# Patient Record
Sex: Female | Born: 1990 | Race: White | Hispanic: No | Marital: Single | Smoking: Never smoker
Health system: Southern US, Community
[De-identification: ages and names within clinical notes are randomized; demographics above are authoritative.]

## PROBLEM LIST (undated history)

## (undated) DIAGNOSIS — D352 Benign neoplasm of pituitary gland: Secondary | ICD-10-CM

## (undated) DIAGNOSIS — K760 Fatty (change of) liver, not elsewhere classified: Secondary | ICD-10-CM

## (undated) HISTORY — PX: NO PAST SURGERIES: SHX2092

## (undated) HISTORY — DX: Benign neoplasm of pituitary gland: D35.2

## (undated) HISTORY — DX: Fatty (change of) liver, not elsewhere classified: K76.0

---

## 2015-01-19 ENCOUNTER — Other Ambulatory Visit (HOSPITAL_COMMUNITY): Payer: Self-pay | Admitting: General Practice

## 2015-01-19 ENCOUNTER — Ambulatory Visit (HOSPITAL_COMMUNITY)
Admission: RE | Admit: 2015-01-19 | Discharge: 2015-01-19 | Disposition: A | Discharge status: Home / Self Care | Payer: Self-pay | Source: Ambulatory Visit | Attending: General Practice | Admitting: General Practice

## 2015-01-19 DIAGNOSIS — A150 Tuberculosis of lung: Secondary | ICD-10-CM

## 2016-05-19 DIAGNOSIS — Z0000 Encounter for general adult medical examination without abnormal findings: Secondary | ICD-10-CM | POA: Diagnosis not present

## 2016-05-21 DIAGNOSIS — Z0000 Encounter for general adult medical examination without abnormal findings: Secondary | ICD-10-CM | POA: Diagnosis not present

## 2016-05-21 DIAGNOSIS — K760 Fatty (change of) liver, not elsewhere classified: Secondary | ICD-10-CM | POA: Diagnosis not present

## 2016-05-21 DIAGNOSIS — R740 Nonspecific elevation of levels of transaminase and lactic acid dehydrogenase [LDH]: Secondary | ICD-10-CM | POA: Diagnosis not present

## 2016-05-21 DIAGNOSIS — Z6834 Body mass index (BMI) 34.0-34.9, adult: Secondary | ICD-10-CM | POA: Diagnosis not present

## 2016-05-21 DIAGNOSIS — N926 Irregular menstruation, unspecified: Secondary | ICD-10-CM | POA: Diagnosis not present

## 2017-11-30 ENCOUNTER — Telehealth: Payer: No Typology Code available for payment source | Admitting: Family

## 2017-11-30 ENCOUNTER — Ambulatory Visit: Payer: Self-pay | Admitting: Physician Assistant

## 2017-11-30 DIAGNOSIS — H109 Unspecified conjunctivitis: Secondary | ICD-10-CM | POA: Diagnosis not present

## 2017-11-30 MED ORDER — POLYMYXIN B-TRIMETHOPRIM 10000-0.1 UNIT/ML-% OP SOLN: 2.0000 [drp] | Freq: Four times a day (QID) | OPHTHALMIC | 0 refills | Status: DC

## 2017-11-30 MED FILL — POLYMYXIN B/TMP EYE DROPS: 10000-0.1 | 5 days supply | Qty: 10 | Fill #0

## 2017-11-30 NOTE — Progress Notes (Signed)
Thank you for the details you included in the comment boxes. Those details are very helpful in determining the best course of treatment for you and help [place] to provide the best care.  We are sorry that you are not feeling well.  Here is how we plan to help!  Based on what you have shared with me it looks like you have conjunctivitis.  Conjunctivitis is a common inflammatory or infectious condition of the eye that is often referred to as "pink eye".  In most cases it is contagious (viral or bacterial). However, not all conjunctivitis requires antibiotics (ex. Allergic).  We have made appropriate suggestions for you based upon your presentation.  I have prescribed Polytrim Ophthalmic drops, (BOTH EYES), 2 drops 4 times a day times 5 days  Pink eye can be highly contagious.  It is typically spread through direct contact with secretions, or contaminated objects or surfaces that one may have touched.  Strict handwashing is suggested with soap and water is urged.  If not available, use alcohol based had sanitizer.  Avoid unnecessary touching of the eye.  If you wear contact lenses, you will need to refrain from wearing them until you see no white discharge from the eye for at least 24 hours after being on medication.  You should see symptom improvement in 1-2 days after starting the medication regimen.  Call us if symptoms are not improved in 1-2 days.  Home Care:  Wash your hands often!  Do not wear your contacts until you complete your treatment plan.  Avoid sharing towels, bed linen, personal items with a person who has pink eye.  See attention for anyone in your home with similar symptoms.  Get Help Right Away If:  Your symptoms do not improve.  You develop blurred or loss of vision.  Your symptoms worsen (increased discharge, pain or redness)  Your e-visit answers were reviewed by a board certified advanced clinical practitioner to complete your personal care plan.  Depending on the  condition, your plan could have included both over the counter or prescription medications.  If there is a problem please reply  once you have received a response from your provider.  Your safety is important to [place].  If you have drug allergies check your prescription carefully.    You can use MyChart to ask questions about today's visit, request a non-urgent call back, or ask for a work or school excuse for 24 hours related to this e-Visit. If it has been greater than 24 hours you will need to follow up with your provider, or enter a new e-Visit to address those concerns.   You will get an e-mail in the next two days asking about your experience.  I hope that your e-visit has been valuable and will speed your recovery. Thank you for using e-visits.

## 2017-12-03 ENCOUNTER — Ambulatory Visit (INDEPENDENT_AMBULATORY_CARE_PROVIDER_SITE_OTHER): Payer: No Typology Code available for payment source | Admitting: Family Medicine

## 2017-12-03 ENCOUNTER — Encounter: Payer: Self-pay | Admitting: Family Medicine

## 2017-12-03 VITALS — BP 108/78 | HR 93 | Temp 99.0°F | Ht 64.0 in | Wt 212.1 lb

## 2017-12-03 DIAGNOSIS — Z23 Encounter for immunization: Secondary | ICD-10-CM

## 2017-12-03 DIAGNOSIS — Z114 Encounter for screening for human immunodeficiency virus [HIV]: Secondary | ICD-10-CM | POA: Diagnosis not present

## 2017-12-03 DIAGNOSIS — D352 Benign neoplasm of pituitary gland: Secondary | ICD-10-CM | POA: Diagnosis not present

## 2017-12-03 DIAGNOSIS — Z0000 Encounter for general adult medical examination without abnormal findings: Secondary | ICD-10-CM

## 2017-12-03 DIAGNOSIS — N912 Amenorrhea, unspecified: Secondary | ICD-10-CM | POA: Diagnosis not present

## 2017-12-03 NOTE — Patient Instructions (Signed)
Call Center for [place] at [facility] at [phone] for an appointment.  They are located at [address], [place], [place], [address] (right across the hall from our office).  Keep up the good work.  Stop the eye drops for now.  Artificial tears like Refresh and Systane may be used for comfort. OK to get generic version. Generally people use them every 2-4 hours, but you can use them as much as you want because there is no medication in it.  Let us know if you need anything.

## 2017-12-03 NOTE — Progress Notes (Signed)
Chief Complaint  Patient presents with  . Establish Care     Well Woman Kristen Neal is here for a complete physical.   Her last physical was >1 year ago.  Current diet: in general, a "healthy" diet  . Current exercise: 2x/week. Weight is stable and she denies daytime fatigue. Seatbelt? Yes  Health Maintenance Pap/HPV- Yes Tetanus- No HIV screening- No  Past Medical History:  Diagnosis Date  . 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 No pertinent surgical history.  Medications  Taking no meds routinely.   Allergies Allergies  Allergen Reactions  . Amoxicillin Rash  . Sulfa Antibiotics     hives    Review of Systems: Constitutional:  no fevers Eye:  no recent significant change in vision Ear/Nose/Mouth/Throat:  Ears:  no tinnitus or vertigo and no recent change in hearing, Nose/Mouth/Throat:  no complaints of nasal congestion, no sore throat Cardiovascular: no chest pain, no palpitations Respiratory:  no cough and no shortness of breath Gastrointestinal:  no abdominal pain, no change in bowel habits, no significant change in appetite, no nausea, vomiting, diarrhea, or constipation and no black or bloody stool GU:  Female: negative for dysuria, frequency, and incontinence; no abnormal bleeding, pelvic pain, or discharge Musculoskeletal/Extremities:  no pain of the joints Integumentary (Skin/Breast):  no abnormal skin lesions reported Neurologic:  no headaches Endocrine:  denies fatigue Hematologic/Lymphatic:  no unexpected weight changes  Exam BP 108/78 (BP Location: Left Arm, Patient Position: Sitting, Cuff Size: Large)   Pulse 93   Temp 99 F (37.2 C) (Oral)   Ht 5\' 4"  (1.626 m)   Wt 212 lb 2 oz (96.2 kg)   SpO2 99%   BMI 36.41 kg/m  General:  well developed, well nourished, in no apparent distress Skin:  no significant moles, warts, or growths Head:  no masses, lesions, or tenderness Eyes:  pupils equal and round, sclera anicteric without  injection Ears:  canals without lesions, TMs shiny without retraction, no obvious effusion, no erythema Nose:  nares patent, septum midline, mucosa normal, and no drainage or sinus tenderness Throat/Pharynx:  lips and gingiva without lesion; tongue and uvula midline; non-inflamed pharynx; no exudates or postnasal drainage Neck: neck supple without adenopathy, thyromegaly, or masses Breasts:  Not done Thorax:  nontender Lungs:  clear to auscultation, breath sounds equal bilaterally, no respiratory distress Cardio:  regular rate and rhythm without murmurs, heart sounds without clicks or rubs, point of maximal impulse normal; no lifts, heaves, or thrills Abdomen:  abdomen soft, nontender; bowel sounds normal; no masses or organomegaly Genital: Defer to GYN Musculoskeletal:  symmetrical muscle groups noted without atrophy or deformity Extremities:  no clubbing, cyanosis, or edema, no deformities, no skin discoloration Neuro:  gait normal; deep tendon reflexes normal and symmetric Psych: well oriented with normal range of affect and appropriate judgment/insight  Assessment and Plan  Well adult exam - Plan: CBC, Comprehensive metabolic panel, Lipid panel  Amenorrhea - Plan: TSH, LH, FSH  Screening for HIV (human immunodeficiency virus) - Plan: HIV antibody   Well 27 y.o. female. Counseled on diet and exercise. Other orders as above. GYN info across hall given for her pap and amenorrhea issues.  Follow up in 1 yr or prn. The patient voiced understanding and agreement to the plan.  [place], DO 12/03/17 4:07 PM

## 2017-12-03 NOTE — Progress Notes (Signed)
Pre visit review using our clinic review tool, if applicable. No additional management support is needed unless otherwise documented below in the visit note. 

## 2017-12-03 NOTE — Addendum Note (Signed)
Addended by: Sharon Seller B on: 12/03/2017 04:17 PM   Modules accepted: Orders

## 2017-12-10 ENCOUNTER — Other Ambulatory Visit (INDEPENDENT_AMBULATORY_CARE_PROVIDER_SITE_OTHER): Payer: No Typology Code available for payment source

## 2017-12-10 DIAGNOSIS — N912 Amenorrhea, unspecified: Secondary | ICD-10-CM

## 2017-12-10 LAB — CBC
HCT: 39.3 % (ref 36.0–46.0)
Hemoglobin: 13.6 g/dL (ref 12.0–15.0)
MCHC: 34.6 g/dL (ref 30.0–36.0)
MCV: 88.7 fl (ref 78.0–100.0)
Platelets: 309 10*3/uL (ref 150.0–400.0)
RBC: 4.43 Mil/uL (ref 3.87–5.11)
RDW: 12.2 % (ref 11.5–15.5)
WBC: 7.7 10*3/uL (ref 4.0–10.5)

## 2017-12-10 LAB — COMPREHENSIVE METABOLIC PANEL
ALT: 48 U/L — AB (ref 0–35)
AST: 31 U/L (ref 0–37)
Albumin: 4.4 g/dL (ref 3.5–5.2)
Alkaline Phosphatase: 50 U/L (ref 39–117)
BILIRUBIN TOTAL: 0.7 mg/dL (ref 0.2–1.2)
BUN: 11 mg/dL (ref 6–23)
CHLORIDE: 103 meq/L (ref 96–112)
CO2: 28 meq/L (ref 19–32)
Calcium: 10.1 mg/dL (ref 8.4–10.5)
Creatinine, Ser: 0.71 mg/dL (ref 0.40–1.20)
GFR: 103.47 mL/min (ref >60.00)
GLUCOSE: 106 mg/dL — AB (ref 70–99)
Potassium: 3.8 mEq/L (ref 3.5–5.1)
Sodium: 138 mEq/L (ref 135–145)
Total Protein: 7.5 g/dL (ref 6.0–8.3)

## 2017-12-10 LAB — LIPID PANEL
Cholesterol: 156 mg/dL (ref 0–200)
HDL: 46.1 mg/dL (ref >39.00)
LDL CALC: 84 mg/dL (ref 0–99)
NonHDL: 110.16
Total CHOL/HDL Ratio: 3
Triglycerides: 130 mg/dL (ref 0.0–149.0)
VLDL: 26 mg/dL (ref 0.0–40.0)

## 2017-12-10 LAB — LUTEINIZING HORMONE: LH: 11.11 m[IU]/mL

## 2017-12-10 LAB — TSH: TSH: 1.46 u[IU]/mL (ref 0.35–4.50)

## 2017-12-10 LAB — FOLLICLE STIMULATING HORMONE: FSH: 7.9 m[IU]/mL

## 2017-12-11 ENCOUNTER — Other Ambulatory Visit (INDEPENDENT_AMBULATORY_CARE_PROVIDER_SITE_OTHER): Payer: No Typology Code available for payment source

## 2017-12-11 DIAGNOSIS — R739 Hyperglycemia, unspecified: Secondary | ICD-10-CM | POA: Diagnosis not present

## 2017-12-11 DIAGNOSIS — R740 Nonspecific elevation of levels of transaminase and lactic acid dehydrogenase [LDH]: Secondary | ICD-10-CM | POA: Diagnosis not present

## 2017-12-11 DIAGNOSIS — R7401 Elevation of levels of liver transaminase levels: Secondary | ICD-10-CM

## 2017-12-11 LAB — HEPATIC FUNCTION PANEL
ALT: 48 U/L — ABNORMAL HIGH (ref 0–35)
AST: 32 U/L (ref 0–37)
Albumin: 4.4 g/dL (ref 3.5–5.2)
Alkaline Phosphatase: 50 U/L (ref 39–117)
Bilirubin, Direct: 0.2 mg/dL (ref 0.0–0.3)
Total Bilirubin: 0.7 mg/dL (ref 0.2–1.2)
Total Protein: 7.4 g/dL (ref 6.0–8.3)

## 2017-12-11 LAB — HIV ANTIBODY (ROUTINE TESTING W REFLEX): HIV 1&2 Ab, 4th Generation: NONREACTIVE

## 2017-12-11 LAB — HEMOGLOBIN A1C: Hgb A1c MFr Bld: 5.3 % (ref 4.6–6.5)

## 2017-12-14 ENCOUNTER — Telehealth: Payer: Self-pay | Admitting: Family Medicine

## 2017-12-14 ENCOUNTER — Other Ambulatory Visit: Payer: Self-pay | Admitting: Family Medicine

## 2017-12-14 DIAGNOSIS — R740 Nonspecific elevation of levels of transaminase and lactic acid dehydrogenase [LDH]: Principal | ICD-10-CM

## 2017-12-14 DIAGNOSIS — R7401 Elevation of levels of liver transaminase levels: Secondary | ICD-10-CM

## 2017-12-14 NOTE — Telephone Encounter (Signed)
Copied from [place] [id number]. Topic: Inquiry >> Dec 14, 2017  4:40 PM Arletha Grippe wrote: Reason for CRM: pt is following up on whether or not she needs  re-draw of labs? The labs were added on to old blood work, pt did not come in a second time like it was indicated.  Cb for pt is [phone] Pt would like to come asap for lab draw

## 2017-12-15 NOTE — Telephone Encounter (Signed)
Pt is still confused re: labs. Per Dr. Irene Limbo note, he had given her directions to follow prior to additional lab draw;   " I am going to order a repeat liver test and add on for the sugar and see what it shows. Stay well hydrated and avoid Tylenol or alcohol before the testing."   She states given this was an "add on" she did not follow his directions prior to an actual redraw.   She is questioning if she needs to come in for a true redraw after following directions (hydration, no alcohol, no tylenol) or if this is in reference to her 3 month follow up.

## 2017-12-16 ENCOUNTER — Encounter: Payer: Self-pay | Admitting: Family Medicine

## 2017-12-16 NOTE — Telephone Encounter (Signed)
This message was from yesterday (Tuesday) please clarify as seems to be some confusion

## 2017-12-16 NOTE — Telephone Encounter (Signed)
Sorry, there is conflicting info. I want her to come in for a true redraw in the next week or so. There was a redraw on the same blood sample, which is useless. I am retracting the statement about following up in 3 months until we see the results of the redraw on a new sample. TY.

## 2017-12-17 NOTE — Telephone Encounter (Signed)
Patient notified.  appt made for 12/24/17

## 2017-12-17 NOTE — Telephone Encounter (Signed)
Called left message to call back 

## 2017-12-24 ENCOUNTER — Other Ambulatory Visit (INDEPENDENT_AMBULATORY_CARE_PROVIDER_SITE_OTHER): Payer: No Typology Code available for payment source

## 2017-12-24 DIAGNOSIS — R7401 Elevation of levels of liver transaminase levels: Secondary | ICD-10-CM

## 2017-12-24 DIAGNOSIS — R740 Nonspecific elevation of levels of transaminase and lactic acid dehydrogenase [LDH]: Secondary | ICD-10-CM | POA: Diagnosis not present

## 2017-12-24 DIAGNOSIS — R739 Hyperglycemia, unspecified: Secondary | ICD-10-CM

## 2017-12-24 LAB — HEMOGLOBIN A1C: HEMOGLOBIN A1C: 5.3 % (ref 4.6–6.5)

## 2017-12-25 LAB — HEPATIC FUNCTION PANEL
ALK PHOS: 46 U/L (ref 39–117)
ALT: 50 U/L — ABNORMAL HIGH (ref 0–35)
AST: 31 U/L (ref 0–37)
Albumin: 4.4 g/dL (ref 3.5–5.2)
BILIRUBIN TOTAL: 0.4 mg/dL (ref 0.2–1.2)
Bilirubin, Direct: 0.1 mg/dL (ref 0.0–0.3)
Total Protein: 7.4 g/dL (ref 6.0–8.3)

## 2017-12-28 ENCOUNTER — Other Ambulatory Visit: Payer: Self-pay | Admitting: Family Medicine

## 2018-01-06 ENCOUNTER — Encounter: Payer: Self-pay | Admitting: Family Medicine

## 2018-03-17 ENCOUNTER — Telehealth: Payer: No Typology Code available for payment source | Admitting: Family

## 2018-03-17 DIAGNOSIS — B9689 Other specified bacterial agents as the cause of diseases classified elsewhere: Secondary | ICD-10-CM | POA: Diagnosis not present

## 2018-03-17 DIAGNOSIS — J208 Acute bronchitis due to other specified organisms: Secondary | ICD-10-CM

## 2018-03-17 DIAGNOSIS — R05 Cough: Secondary | ICD-10-CM

## 2018-03-17 DIAGNOSIS — R059 Cough, unspecified: Secondary | ICD-10-CM

## 2018-03-17 DIAGNOSIS — R509 Fever, unspecified: Secondary | ICD-10-CM

## 2018-03-17 MED ORDER — AZITHROMYCIN 250 MG PO TABS: ORAL_TABLET | ORAL | 0 refills | Status: DC

## 2018-03-17 MED ORDER — BENZONATATE 100 MG PO CAPS: 100.0000 mg | ORAL_CAPSULE | Freq: Three times a day (TID) | ORAL | 0 refills | Status: DC | PRN

## 2018-03-17 MED ORDER — PREDNISONE 10 MG (21) PO TBPK: ORAL_TABLET | ORAL | 0 refills | Status: DC

## 2018-03-17 NOTE — Progress Notes (Signed)
We are sorry that you are not feeling well.  Here is how we plan to help!  Based on your presentation I believe you most likely have A cough due to bacteria.  When patients have a fever and a productive cough with a change in color or increased sputum production, we are concerned about bacterial bronchitis.  If left untreated it can progress to pneumonia.  If your symptoms do not improve with your treatment plan it is important that you contact your provider.   I have prescribed Azithromyin 250 mg: two tablets now and then one tablet daily for 4 additonal days    In addition you may use A non-prescription cough medication called Robitussin DAC. Take 2 teaspoons every 8 hours or Delsym: take 2 teaspoons every 12 hours., A non-prescription cough medication called Mucinex DM: take 2 tablets every 12 hours. and A prescription cough medication called Tessalon Perles 100mg. You may take 1-2 capsules every 8 hours as needed for your cough.  Prednisone 10 mg daily for 6 days (see taper instructions below)  From your responses in the eVisit questionnaire you describe inflammation in the upper respiratory tract which is causing a significant cough.  This is commonly called Bronchitis and has four common causes:    Allergies  Viral Infections  Acid Reflux  Bacterial Infection Allergies, viruses and acid reflux are treated by controlling symptoms or eliminating the cause. An example might be a cough caused by taking certain blood pressure medications. You stop the cough by changing the medication. Another example might be a cough caused by acid reflux. Controlling the reflux helps control the cough.  USE OF BRONCHODILATOR ("RESCUE") INHALERS: There is a risk from using your bronchodilator too frequently.  The risk is that over-reliance on a medication which only relaxes the muscles surrounding the breathing tubes can reduce the effectiveness of medications prescribed to reduce swelling and congestion of the  tubes themselves.  Although you feel brief relief from the bronchodilator inhaler, your asthma may actually be worsening with the tubes becoming more swollen and filled with mucus.  This can delay other crucial treatments, such as oral steroid medications. If you need to use a bronchodilator inhaler daily, several times per day, you should discuss this with your provider.  There are probably better treatments that could be used to keep your asthma under control.     HOME CARE . Only take medications as instructed by your medical team. . Complete the entire course of an antibiotic. . Drink plenty of fluids and get plenty of rest. . Avoid close contacts especially the very young and the elderly . Cover your mouth if you cough or cough into your sleeve. . Always remember to wash your hands . A steam or ultrasonic humidifier can help congestion.   GET HELP RIGHT AWAY IF: . You develop worsening fever. . You become short of breath . You cough up blood. . Your symptoms persist after you have completed your treatment plan MAKE SURE YOU   Understand these instructions.  Will watch your condition.  Will get help right away if you are not doing well or get worse.  Your e-visit answers were reviewed by a board certified advanced clinical practitioner to complete your personal care plan.  Depending on the condition, your plan could have included both over the counter or prescription medications. If there is a problem please reply  once you have received a response from your provider. Your safety is important to us.    If you have drug allergies check your prescription carefully.    You can use MyChart to ask questions about today's visit, request a non-urgent call back, or ask for a work or school excuse for 24 hours related to this e-Visit. If it has been greater than 24 hours you will need to follow up with your provider, or enter a new e-Visit to address those concerns. You will get an e-mail in  the next two days asking about your experience.  I hope that your e-visit has been valuable and will speed your recovery. Thank you for using e-visits.   

## 2018-03-18 MED FILL — AZITHROMYCIN 250 MG TABLET: 250 | 5 days supply | Qty: 6 | Fill #0

## 2018-03-18 MED FILL — BENZONATATE 100 MG CAPSULE: 100 | 7 days supply | Qty: 20 | Fill #0

## 2018-03-18 MED FILL — predniSONE 10 MG TABS: 10 | 6 days supply | Qty: 21 | Fill #0

## 2018-03-22 ENCOUNTER — Telehealth: Payer: No Typology Code available for payment source | Admitting: Family

## 2018-03-22 DIAGNOSIS — B379 Candidiasis, unspecified: Secondary | ICD-10-CM | POA: Diagnosis not present

## 2018-03-22 MED ORDER — NYSTATIN-TRIAMCINOLONE 100000-0.1 UNIT/GM-% EX OINT: 1.0000 "application " | TOPICAL_OINTMENT | Freq: Two times a day (BID) | CUTANEOUS | 0 refills | Status: DC

## 2018-03-22 MED FILL — NYSTATIN-TRIAMCINOLONE OINT: 100000-0.1 | 30 days supply | Qty: 30 | Fill #0

## 2018-03-22 NOTE — Progress Notes (Signed)
E Visit for Rash  We are sorry that you are not feeling well. Here is how we plan to help!  Yeast infection of the skin. Continue your current medications. I have also sent   Nystatin cream apply to the affected area twice daily   HOME CARE:   Take cool showers and avoid direct sunlight.  Apply cool compress or wet dressings.  Take a bath in an oatmeal bath.  Sprinkle content of one Aveeno packet under running faucet with comfortably warm water.  Bathe for 15-20 minutes, 1-2 times daily.  Pat dry with a towel. Do not rub the rash.  Use hydrocortisone cream.  Take an antihistamine like Benadryl for widespread rashes that itch.  The adult dose of Benadryl is 25-50 mg by mouth 4 times daily.  Caution:  This type of medication may cause sleepiness.  Do not drink alcohol, drive, or operate dangerous machinery while taking antihistamines.  Do not take these medications if you have prostate enlargement.  Read package instructions thoroughly on all medications that you take.  GET HELP RIGHT AWAY IF:   Symptoms don't go away after treatment.  Severe itching that persists.  If you rash spreads or swells.  If you rash begins to smell.  If it blisters and opens or develops a yellow-brown crust.  You develop a fever.  You have a sore throat.  You become short of breath.  MAKE SURE YOU:  Understand these instructions. Will watch your condition. Will get help right away if you are not doing well or get worse.  Thank you for choosing an e-visit. Your e-visit answers were reviewed by a board certified advanced clinical practitioner to complete your personal care plan. Depending upon the condition, your plan could have included both over the counter or prescription medications. Please review your pharmacy choice. Be sure that the pharmacy you have chosen is open so that you can pick up your prescription now.  If there is a problem you may message your provider in [place] to have the  prescription routed to another pharmacy. Your safety is important to [place]. If you have drug allergies check your prescription carefully.  For the next 24 hours, you can use MyChart to ask questions about today's visit, request a non-urgent call back, or ask for a work or school excuse from your e-visit provider. You will get an email in the next two days asking about your experience. I hope that your e-visit has been valuable and will speed your recovery.

## 2018-04-19 ENCOUNTER — Ambulatory Visit: Payer: Private Health Insurance - Indemnity | Admitting: Family Medicine

## 2018-12-06 ENCOUNTER — Ambulatory Visit (INDEPENDENT_AMBULATORY_CARE_PROVIDER_SITE_OTHER): Payer: No Typology Code available for payment source | Admitting: Family Medicine

## 2018-12-06 ENCOUNTER — Encounter: Payer: Self-pay | Admitting: Family Medicine

## 2018-12-06 VITALS — BP 108/72 | HR 110 | Temp 98.2°F | Ht 65.0 in | Wt 215.4 lb

## 2018-12-06 DIAGNOSIS — D352 Benign neoplasm of pituitary gland: Secondary | ICD-10-CM | POA: Diagnosis not present

## 2018-12-06 DIAGNOSIS — R740 Nonspecific elevation of levels of transaminase and lactic acid dehydrogenase [LDH]: Secondary | ICD-10-CM | POA: Diagnosis not present

## 2018-12-06 DIAGNOSIS — Z0000 Encounter for general adult medical examination without abnormal findings: Secondary | ICD-10-CM | POA: Diagnosis not present

## 2018-12-06 DIAGNOSIS — R7401 Elevation of levels of liver transaminase levels: Secondary | ICD-10-CM

## 2018-12-06 MED ORDER — FLUOCINOLONE ACETONIDE 0.01 % OT OIL: TOPICAL_OIL | OTIC | 0 refills | Status: AC

## 2018-12-06 MED FILL — FLUOCINOLONE ACETONIDE 0.01: 0.01 | 10 days supply | Qty: 20 | Fill #0

## 2018-12-06 NOTE — Progress Notes (Signed)
Chief Complaint  Patient presents with  . Annual Exam     Well Woman Kristen Neal is here for a complete physical.   Her last physical was >1 year ago.  Current diet: in general, an "OK" diet. Current exercise: Tennis. No daytime fatigue No LMP recorded. Seatbelt? Yes  Health Maintenance Pap/HPV- No Tetanus- Yes HIV screening- Yes  Past Medical History:  Diagnosis Date  . 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 NO PAST SURGERIES     Medications  Takes no meds routinely.  Allergies Allergies  Allergen Reactions  . Amoxicillin Rash  . Sulfa Antibiotics     hives    Review of Systems: Constitutional:  no unexpected weight changes Eye:  no recent significant change in vision Ear/Nose/Mouth/Throat:  Ears:  no tinnitus or vertigo and no recent change in hearing Nose/Mouth/Throat:  no complaints of nasal congestion, no sore throat Cardiovascular: no chest pain Respiratory:  no cough and no shortness of breath Gastrointestinal:  no abdominal pain, no change in bowel habits GU:  Female: negative for dysuria or pelvic pain Musculoskeletal/Extremities:  no pain of the joints Integumentary (Skin/Breast):  no abnormal skin lesions reported Neurologic:  no headaches Endocrine:  denies fatigue Hematologic/Lymphatic:  No areas of easy bleeding  Exam BP 108/72 (BP Location: Left Arm, Patient Position: Sitting, Cuff Size: Large)   Pulse (!) 110   Temp 98.2 F (36.8 C) (Oral)   Ht 5\' 5"  (1.651 m)   Wt 215 lb 6 oz (97.7 kg)   SpO2 98%   BMI 35.84 kg/m  General:  well developed, well nourished, in no apparent distress Skin:  no significant moles, warts, or growths Head:  no masses, lesions, or tenderness Eyes:  pupils equal and round, sclera anicteric without injection Ears:  canals without lesions, TMs shiny without retraction, no obvious effusion, no erythema Nose:  nares patent, septum midline, mucosa normal, and no drainage or  sinus tenderness Throat/Pharynx:  lips and gingiva without lesion; tongue and uvula midline; non-inflamed pharynx; no exudates or postnasal drainage Neck: neck supple without adenopathy, thyromegaly, or masses Lungs:  clear to auscultation, breath sounds equal bilaterally, no respiratory distress Cardio:  regular rate and rhythm, no bruits, no LE edema Abdomen:  abdomen soft, nontender; bowel sounds normal; no masses or organomegaly Genital: Defer to GYN Musculoskeletal:  symmetrical muscle groups noted without atrophy or deformity Extremities:  no clubbing, cyanosis, or edema, no deformities, no skin discoloration Neuro:  gait normal; deep tendon reflexes normal and symmetric Psych: well oriented with normal range of affect and appropriate judgment/insight  Assessment and Plan  Well adult exam - Plan: CBC, Comprehensive metabolic panel, Lipid panel  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 female. Counseled on diet and exercise. Other orders as above. Follow up in 1 yr or prn. The patient voiced understanding and agreement to the plan.  [place], DO 12/06/18 4:07 PM

## 2018-12-06 NOTE — Patient Instructions (Addendum)
Call Center for [place] at [facility] at [phone] for an appointment.  They are located at [address], [place], [place], [address] (right across the hall from our office).  Give [place] 2-3 business days to get the results of your labs back.   Keep the diet clean and stay active.  OK to use Debrox (peroxide) in the ear to loosen up wax. Also recommend using a bulb syringe (for removing boogers from baby's noses) to flush through warm water. Do not use Q-tips as this can impact wax further.  Get me the results of your ultrasound from 2015.  Put a cotton ball in your right ear after putting the drops in. Keep it there for 15 minutes.   Let us know if you need anything.

## 2018-12-09 ENCOUNTER — Other Ambulatory Visit: Payer: No Typology Code available for payment source

## 2018-12-10 ENCOUNTER — Other Ambulatory Visit (INDEPENDENT_AMBULATORY_CARE_PROVIDER_SITE_OTHER): Payer: No Typology Code available for payment source

## 2018-12-10 ENCOUNTER — Encounter: Payer: Self-pay | Admitting: Family Medicine

## 2018-12-10 DIAGNOSIS — D352 Benign neoplasm of pituitary gland: Secondary | ICD-10-CM

## 2018-12-10 DIAGNOSIS — Z0000 Encounter for general adult medical examination without abnormal findings: Secondary | ICD-10-CM | POA: Diagnosis not present

## 2018-12-10 DIAGNOSIS — R740 Nonspecific elevation of levels of transaminase and lactic acid dehydrogenase [LDH]: Secondary | ICD-10-CM

## 2018-12-10 DIAGNOSIS — R7401 Elevation of levels of liver transaminase levels: Secondary | ICD-10-CM

## 2018-12-10 LAB — COMPREHENSIVE METABOLIC PANEL
ALT: 51 U/L — ABNORMAL HIGH (ref 0–35)
AST: 28 U/L (ref 0–37)
Albumin: 4.7 g/dL (ref 3.5–5.2)
Alkaline Phosphatase: 54 U/L (ref 39–117)
BUN: 12 mg/dL (ref 6–23)
CO2: 29 mEq/L (ref 19–32)
Calcium: 9.7 mg/dL (ref 8.4–10.5)
Chloride: 102 mEq/L (ref 96–112)
Creatinine, Ser: 0.69 mg/dL (ref 0.40–1.20)
GFR: 99.93 mL/min (ref >60.00)
Glucose, Bld: 124 mg/dL — ABNORMAL HIGH (ref 70–99)
Potassium: 4.3 mEq/L (ref 3.5–5.1)
Sodium: 139 mEq/L (ref 135–145)
Total Bilirubin: 0.6 mg/dL (ref 0.2–1.2)
Total Protein: 7.3 g/dL (ref 6.0–8.3)

## 2018-12-10 LAB — CBC
HCT: 40.5 % (ref 36.0–46.0)
Hemoglobin: 14 g/dL (ref 12.0–15.0)
MCHC: 34.5 g/dL (ref 30.0–36.0)
MCV: 88.8 fl (ref 78.0–100.0)
Platelets: 312 10*3/uL (ref 150.0–400.0)
RBC: 4.56 Mil/uL (ref 3.87–5.11)
RDW: 12.3 % (ref 11.5–15.5)
WBC: 6.3 10*3/uL (ref 4.0–10.5)

## 2018-12-10 LAB — LIPID PANEL
Cholesterol: 191 mg/dL (ref 0–200)
HDL: 41.9 mg/dL (ref >39.00)
LDL CALC: 114 mg/dL — AB (ref 0–99)
NonHDL: 149.54
Total CHOL/HDL Ratio: 5
Triglycerides: 177 mg/dL — ABNORMAL HIGH (ref 0.0–149.0)
VLDL: 35.4 mg/dL (ref 0.0–40.0)

## 2018-12-13 LAB — HEPATITIS C ANTIBODY
HEP C AB: NONREACTIVE
SIGNAL TO CUT-OFF: 0.01 (ref <1.00)

## 2018-12-13 LAB — PROLACTIN: Prolactin: 10.9 ng/mL

## 2018-12-13 LAB — HEPATITIS B SURFACE ANTIGEN: HEP B S AG: NONREACTIVE

## 2018-12-15 ENCOUNTER — Other Ambulatory Visit: Payer: Self-pay | Admitting: Family Medicine

## 2018-12-15 DIAGNOSIS — R7401 Elevation of levels of liver transaminase levels: Secondary | ICD-10-CM

## 2018-12-15 DIAGNOSIS — R740 Nonspecific elevation of levels of transaminase and lactic acid dehydrogenase [LDH]: Principal | ICD-10-CM

## 2019-01-07 ENCOUNTER — Encounter: Payer: Self-pay | Admitting: Family Medicine

## 2019-01-10 ENCOUNTER — Encounter: Payer: Self-pay | Admitting: Family Medicine

## 2019-01-10 ENCOUNTER — Encounter: Payer: No Typology Code available for payment source | Admitting: Obstetrics & Gynecology

## 2019-01-10 DIAGNOSIS — K760 Fatty (change of) liver, not elsewhere classified: Secondary | ICD-10-CM | POA: Insufficient documentation

## 2019-02-24 ENCOUNTER — Encounter: Payer: No Typology Code available for payment source | Admitting: Obstetrics & Gynecology

## 2019-04-15 ENCOUNTER — Ambulatory Visit (INDEPENDENT_AMBULATORY_CARE_PROVIDER_SITE_OTHER): Payer: No Typology Code available for payment source | Admitting: Obstetrics & Gynecology

## 2019-04-15 ENCOUNTER — Encounter: Payer: Self-pay | Admitting: Obstetrics & Gynecology

## 2019-04-15 ENCOUNTER — Other Ambulatory Visit: Payer: Self-pay

## 2019-04-15 VITALS — BP 135/76 | HR 72 | Wt 213.8 lb

## 2019-04-15 DIAGNOSIS — Z01419 Encounter for gynecological examination (general) (routine) without abnormal findings: Secondary | ICD-10-CM | POA: Diagnosis not present

## 2019-04-15 DIAGNOSIS — N914 Secondary oligomenorrhea: Secondary | ICD-10-CM

## 2019-04-15 DIAGNOSIS — D352 Benign neoplasm of pituitary gland: Secondary | ICD-10-CM

## 2019-04-15 MED ORDER — MEDROXYPROGESTERONE ACETATE 10 MG PO TABS: 10.0000 mg | ORAL_TABLET | Freq: Every day | ORAL | 2 refills | Status: AC

## 2019-04-15 MED FILL — MEDROXYPROGESTERONE 10 MG T: 10 | 10 days supply | Qty: 10 | Fill #0

## 2019-04-15 NOTE — Patient Instructions (Signed)
Secondary Amenorrhea  Secondary amenorrhea occurs when a female who was previously having menstrual periods has not had them for 3-6 months. A menstrual period is the monthly shedding of the lining of the uterus. Menstruation involves the passing of blood, tissue, fluid, and mucus through the vagina. The flow of blood usually occurs during 3-7 consecutive days each month. This condition has many causes. In many cases, treating the underlying cause will return menstrual periods back to a normal cycle. What are the causes? The most common cause of this condition is pregnancy. Other causes include:  Malnutrition.  Cirrhosis of the liver.  Conditions of the blood.  Diabetes.  Epilepsy.  Chronic kidney disease.  Polycystic ovary disease.  Stress or anxiety.  A hormonal imbalance.  Ovarian failure.  Medicines.  Extreme obesity.  Cystic fibrosis.  Low body weight or drastic weight loss.  Early menopause.  Removal of the ovaries or uterus.  Contraceptive pills, patches, or vaginal rings.  Cushing syndrome.  Thyroid problems. What increases the risk? You are more likely to develop this condition if:  You have a family history of this condition.  You have an eating disorder.  You do extreme athletic training.  You have a chronic disease.  You abuse substances such as alcohol or cigarettes. What are the signs or symptoms? The main symptom of this condition is a lack of menstrual periods for 3-6 months. How is this diagnosed? This condition may be diagnosed based on:  Your medical history.  A physical exam.  A pelvic exam to check for problems with your reproductive organs.  A procedure to examine the uterus.  A measurement of your body mass index (BMI).  Tests, such as: ? Blood tests that measure certain hormones in your body and rule out pregnancy. ? Urine tests. ? Imaging tests, such as an ultrasound, CT scan, or MRI. How is this treated? Treatment  for this condition depends on the cause of the amenorrhea. It may involve:  Correcting dietary problems.  Treating underlying conditions.  Medicines.  Lifestyle changes.  Surgery. If the condition cannot be corrected, it is sometimes possible to trigger menstrual periods with medicines. Follow these instructions at home: Lifestyle  Maintain a healthy diet. Ask to meet with a registered dietitian for nutrition counseling and meal planning.  Maintain a healthy weight. Talk to your health care provider before trying any new diet or exercise plan.  Exercise at least 30 minutes 5 or more days each week. Exercising includes brisk walking, yard work, biking, running, swimming, and team sports like basketball and soccer. Ask your health care provider which exercises are safe for you.  Get enough sleep. Plan your sleep time to allow for 7-9 hours of sleep each night.  Learn to manage stress. Explore relaxation techniques such as meditation, journaling, yoga, or tai chi. General instructions  Be aware of changes in your menstrual cycle. Keep a record of when you have your menstrual period. Note the date your period starts, how long it lasts, and any problems you experience.  Take over-the-counter and prescription medicines only as told by your health care provider.  Keep all follow-up visits as told by your health care provider. This is important. Contact a health care provider if:  Your periods do not return to normal after treatment. Summary  Secondary amenorrhea is when a female who was previously having menstrual periods has not gotten her period for 3-6 months.  This condition has many causes. In many cases, treating the underlying   cause will return menstrual periods back to a normal cycle.  Talk to your health care provider if your periods do not return to normal after treatment. This information is not intended to replace advice given to you by your health care provider. Make  sure you discuss any questions you have with your health care provider. Document Released: 11/03/2006 Document Revised: 03/04/2018 Document Reviewed: 12/11/2016 Elsevier Patient Education  [place]. Pituitary Tumors  Pituitary tumors are abnormal growths found in the pituitary gland. The pituitary gland is a small organ in the center of the brain. It makes hormones that affect growth and the functions of other glands in the body. In most cases, pituitary tumors grow slowly, are not cancerous (benign), and do not spread to other parts of the body. These tumors are best treated when they are found and diagnosed early. A pituitary tumor may produce hormones (functioning tumor) or not (non-functioning tumor). A pituitary tumor may cause:  Cushing disease. In this disease, the pituitary gland produces too much of a hormone called cortisol. This causes fat to build up in the face, back, and chest while the arms and legs become thin.  Acromegaly. This is a condition in which the hands, feet, and face are larger than normal.  Breast milk production, even when there is no pregnancy. What are the causes? The cause of most pituitary tumors is not known. In some cases, pituitary tumors may be passed from parent to child (inherited). What increases the risk? You are more likely to develop this condition if:  You have a family history of pituitary tumors.  You have certain syndromes caused by unwanted changes (mutations) in your genes. What are the signs or symptoms? Symptoms of this condition include:  Headaches.  Loss of consciousness.  Vision problems and eye muscle weakness.  Weakness or low energy.  Clear fluid draining from the nose.  Changes in the sense of smell.  Loss of body hair.  Nausea and vomiting.  Problems caused by the production of too many hormones, such as: ? Inability to get pregnant after a year of having sex regularly without using birth control  (infertility). ? Loss of menstrual periods in women. ? Abnormal growth. ? Diabetes insipidus or diabetes mellitus. ? High blood pressure (hypertension). ? Inability to tolerate heat or cold. ? Increase in sweating. ? Joint pain. ? Other skin and body changes. ? Nipple discharge. ? Changes in mood, or depression. ? Decreased sexual function. How is this diagnosed? This condition may be diagnosed based on:  Your symptoms.  Your medical history.  Blood or urine tests to check your hormone levels.  CT scan.  MRI.  Removal and examination of a small tumor tissue (biopsy). How is this treated? Treatment depends on the type of pituitary tumor you have and your overall health. Treatments may include:  Surgical removal of the tumor.  Using high doses of X-ray energy to kill tumor cells (radiation).  Using certain medicines to stop the pituitary gland from producing too many hormones (drug therapy). If you have a family history of pituitary tumors, you may need to have regular blood tests to monitor pituitary hormone levels. Follow these instructions at home:  Drink enough fluid to keep your urine clear or pale yellow.  If directed, follow instructions from your health care provider about measuring how much urine you pass.  Do not pick your nose or remove any crusting, if your nose is draining clear fluid. Tell your health care provider  if this condition worsens.  Do not do any activities that require straining, such as heavy lifting. Ask your health care provider what activities are safe for you.  Take over-the-counter and prescription medicines only as told by your health care provider.  Keep all follow-up visits as told by your health care provider. This is important, especially if you have a family history of pituitary tumors and you need regular blood tests. Contact a health care provider if:  You have sudden, unusual thirst.  You are urinating more often than usual.   You have a headache that does not go away.  You develop new changes in your vision.  You have clear fluid leaking from your nose or ears.  You have a sensation of fluid trickling down the back of your throat.  You have a salty taste in your mouth.  You have trouble concentrating. Get help right away if:  Your symptoms suddenly become severe.  You have a nosebleed that does not stop after a few minutes.  You have a fever of over 101F (38.3C).  You have a severe headache.  You have a stiff neck.  You are confused or not as alert as usual.  You have chest pain.  You have shortness of breath. Summary  Pituitary tumors are abnormal growths found in the pituitary gland.  Treatment depends on the type of pituitary tumor you have and your overall health.  Keep all follow-up visits as told by your health care provider. This is important, especially if you have a family history of pituitary tumors and you need regular blood tests. This information is not intended to replace advice given to you by your health care provider. Make sure you discuss any questions you have with your health care provider. Document Released: 09/12/2002 Document Revised: 01/13/2019 Document Reviewed: 12/09/2016 Elsevier Patient Education  [place].

## 2019-04-15 NOTE — Progress Notes (Signed)
Subjective:     Kristen Neal is a 28 y.o. female here for a routine exam.  Current complaints: pt with a h/o a microadenoma. Last GYN exam 4 years prev. Since that time only 1 period per year. Pt is not sexually active at present. Was last sexually active in 2012. Has not had cx but, did have blood tests for HIV since that time.          Gynecologic History Patient's last menstrual period was 09/05/2018. Contraception: abstinence Last Pap: 4 years prev Results were: normal Last mammogram: n/a.   Obstetric History OB History  No obstetric history on file.   The following portions of the patient's history were reviewed and updated as appropriate: allergies, current medications, past family history, past medical history, past social history, past surgical history and problem list.  Review of Systems Pertinent items are noted in HPI.    Objective:  BP 135/76   Pulse 72   Wt 213 lb 12.8 oz (97 kg)   LMP 09/05/2018   BMI 35.58 kg/m   General Appearance:    Alert, cooperative, no distress, appears stated age  Head:    Normocephalic, without obvious abnormality, atraumatic  Eyes:    conjunctiva/corneas clear, EOM's intact, both eyes  Ears:    Normal external ear canals, both ears  Nose:   Nares normal, septum midline, mucosa normal, no drainage    or sinus tenderness  Throat:   Lips, mucosa, and tongue normal; teeth and gums normal  Neck:   Supple, symmetrical, trachea midline, no adenopathy;    thyroid:  no enlargement/tenderness/nodules  Back:     Symmetric, no curvature, ROM normal, no CVA tenderness  Lungs:     Clear to auscultation bilaterally, respirations unlabored  Chest Wall:    No tenderness or deformity   Heart:    Regular rate and rhythm  Breast Exam:    No tenderness, masses, or nipple abnormality  Abdomen:     Soft, non-tender, bowel sounds active all four quadrants,    no masses, no organomegaly  Genitalia:    Normal female without lesion, discharge or tenderness      Extremities:   Extremities normal, atraumatic, no cyanosis or edema  Pulses:   2+ and symmetric all extremities  Skin:   Skin color, texture, turgor normal, no rashes or lesions    Assessment:    Healthy female exam.   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   Plan:   Provera challenge 10 mg every 5 days. (if no withdrawal will increae to 10 days, may need to increase dosage as well) pt will f/u after first cycle to notify us of the resutls,   MRI to eval microadenoma Needs records from prev GYN  F/u PAP and cx  [name] L. Harraway-Smith, M.D., Cherlynn June

## 2019-04-15 NOTE — Progress Notes (Signed)
18Last pap around four years ago  Sti cultures today

## 2019-04-18 ENCOUNTER — Other Ambulatory Visit: Payer: Self-pay

## 2019-04-18 DIAGNOSIS — G44209 Tension-type headache, unspecified, not intractable: Secondary | ICD-10-CM

## 2019-04-18 DIAGNOSIS — D352 Benign neoplasm of pituitary gland: Secondary | ICD-10-CM

## 2019-04-20 LAB — PAP IG, CT-NG TV RFX HPV ALL
Chlamydia, Nuc. Acid Amp: NEGATIVE
Gonococcus, Nuc. Acid Amp: NEGATIVE
PAP Smear Comment: 0
Trich vag by NAA: NEGATIVE

## 2019-05-16 ENCOUNTER — Other Ambulatory Visit: Payer: No Typology Code available for payment source

## 2019-05-30 ENCOUNTER — Other Ambulatory Visit: Payer: Self-pay

## 2019-05-30 ENCOUNTER — Ambulatory Visit (INDEPENDENT_AMBULATORY_CARE_PROVIDER_SITE_OTHER): Payer: No Typology Code available for payment source

## 2019-05-30 DIAGNOSIS — G44209 Tension-type headache, unspecified, not intractable: Secondary | ICD-10-CM

## 2019-05-30 DIAGNOSIS — D352 Benign neoplasm of pituitary gland: Secondary | ICD-10-CM

## 2019-05-30 MED ORDER — GADOBUTROL 1 MMOL/ML IV SOLN
9.0000 mL | Freq: Once | INTRAVENOUS | Status: AC | PRN
  Administered 2019-05-30: 9 mL via INTRAVENOUS

## 2019-06-02 ENCOUNTER — Telehealth: Payer: Self-pay

## 2019-06-02 NOTE — Telephone Encounter (Signed)
Left message for patient to return call to office. Jennifer Howard  RN 

## 2019-06-02 NOTE — Telephone Encounter (Signed)
-----   Message from Lavonia Drafts, MD sent at 06/02/2019 10:32 AM EDT ----- Please call pt. Her MRI was WNL. No masses noted!  Thx, Clh-S

## 2019-06-08 NOTE — Telephone Encounter (Signed)
Patient called and made aware that her MRI was within normal limits. Patient states she knew this Kathrene Alu RN

## 2019-07-18 MED FILL — MEDROXYPROGESTERONE 10 MG T: 10 | 10 days supply | Qty: 10 | Fill #1

## 2019-08-22 ENCOUNTER — Telehealth: Payer: No Typology Code available for payment source | Admitting: Physician Assistant

## 2019-08-22 DIAGNOSIS — H9209 Otalgia, unspecified ear: Secondary | ICD-10-CM | POA: Diagnosis not present

## 2019-08-22 MED ORDER — OFLOXACIN 0.3 % OT SOLN: 10.0000 [drp] | Freq: Every day | OTIC | 0 refills | Status: AC

## 2019-08-22 MED FILL — OFLOXACIN 0.3 % SOLN: 0.3 | 10 days supply | Qty: 5 | Fill #0

## 2019-08-22 NOTE — Progress Notes (Signed)
E Visit for Swimmer's Ear  We are sorry that you are not feeling well. Here is how we plan to help!  Based on what you have shared with me it looks like you have swimmers ear. Swimmer's ear is a redness or swelling, irritation, or infection of your outer ear canal.  These symptoms usually occur within a few days of swimming.  Your ear canal is a tube that goes from the opening of the ear to the eardrum.  When water stays in your ear canal, germs can grow.  This is a painful condition that often happens to children and swimmers of all ages.  It is not contagious and oral antibiotics are not required to treat uncomplicated swimmer's ear.  The usual symptoms include: Itching inside the ear, Redness or a sense of swelling in the ear, Pain when the ear is tugged on when pressure is placed on the ear, Pus draining from the infected ear.   I have prescribed a medication called ofloxacin. Please use 10 drops into the affected her daily for the next 7 days.  In certain cases swimmer's ear may progress to a more serious bacterial infection of the middle or inner ear.  If you have a fever 102 and up and significantly worsening symptoms, this could indicate a more serious infection moving to the middle/inner and needs face to face evaluation in an office by a provider.  Your symptoms should improve over the next 3 days and should resolve in about 7 days.  HOME CARE:   Wash your hands frequently.  Do not place the tip of the bottle on your ear or touch it with your fingers.  You can take Acetominophen 650 mg every 4-6 hours as needed for pain.  If pain is severe or moderate, you can apply a heating pad (set on low) or hot water bottle (wrapped in a towel) to outer ear for 20 minutes.  This will also increase drainage.  Avoid ear plugs  Do not use Q-tips  After showers, help the water run out by tilting your head to one side.  GET HELP RIGHT AWAY IF:   Fever is over 102.2 degrees.  You develop  progressive ear pain or hearing loss.  Ear symptoms persist longer than 3 days after treatment.  MAKE SURE YOU:   Understand these instructions.  Will watch your condition.  Will get help right away if you are not doing well or get worse.  TO PREVENT SWIMMER'S EAR:  Use a bathing cap or custom fitted swim molds to keep your ears dry.  Towel off after swimming to dry your ears.  Tilt your head or pull your earlobes to allow the water to escape your ear canal.  If there is still water in your ears, consider using a hairdryer on the lowest setting.  Thank you for choosing an e-visit. Your e-visit answers were reviewed by a board certified advanced clinical practitioner to complete your personal care plan. Depending upon the condition, your plan could have included both over the counter or prescription medications. Please review your pharmacy choice. Be sure that the pharmacy you have chosen is open so that you can pick up your prescription now.  If there is a problem you may message your provider in [place] to have the prescription routed to another pharmacy. Your safety is important to [place]. If you have drug allergies check your prescription carefully.  For the next 24 hours, you can use MyChart to ask questions about  today's visit, request a non-urgent call back, or ask for a work or school excuse from your e-visit provider. You will get an email in the next two days asking about your experience. I hope that your e-visit has been valuable and will speed your recovery.  Approximately 5 minutes was spent documenting and reviewing patient's chart.

## 2019-08-24 ENCOUNTER — Encounter: Payer: Self-pay | Admitting: Family Medicine

## 2019-08-24 ENCOUNTER — Ambulatory Visit (INDEPENDENT_AMBULATORY_CARE_PROVIDER_SITE_OTHER): Payer: No Typology Code available for payment source | Admitting: Family Medicine

## 2019-08-24 ENCOUNTER — Other Ambulatory Visit: Payer: Self-pay

## 2019-08-24 VITALS — BP 104/72 | HR 68 | Temp 97.2°F | Ht 65.0 in | Wt 209.5 lb

## 2019-08-24 DIAGNOSIS — H9201 Otalgia, right ear: Secondary | ICD-10-CM | POA: Diagnosis not present

## 2019-08-24 MED ORDER — CEPHALEXIN 500 MG PO CAPS: 500.0000 mg | ORAL_CAPSULE | Freq: Three times a day (TID) | ORAL | 0 refills | Status: AC

## 2019-08-24 MED FILL — CEPHALEXIN 500 MG CAPSULE: 500 | 7 days supply | Qty: 21 | Fill #0

## 2019-08-24 NOTE — Progress Notes (Signed)
Chief Complaint  Patient presents with  . Ear Pain    Pt is here for right ear pain. Duration: 3 days Progression: unchanged Associated symptoms: some allergy s/s's in AM, clear and foul smelling drainage, outer ear pain,  Denies: fevers, bleeding, or discharge from ear Treatment to date: ice, heat pad, peroxide, Ofloxacin drops for 3 d  ROS:  HEENT: +ear pain Costitutional: Denies fevers  Past Medical History:  Diagnosis Date  . Hepatic steatosis    [place] 07/25/14  . Prolactinoma ([place])    Family History  Problem Relation Age of Onset  . Cancer Mother        colon cancer  . High blood pressure Mother   . High Cholesterol Mother   . Diabetes Father   . High blood pressure Father   . High Cholesterol Father   . Diabetes Paternal Grandmother   . COPD Paternal Grandfather    Past Surgical History:  Procedure Laterality Date  . NO PAST SURGERIES      BP 104/72 (BP Location: Left Arm, Patient Position: Sitting, Cuff Size: Normal)   Pulse 68   Temp (!) 97.2 F (36.2 C) (Temporal)   Ht 5\' 5"  (1.651 m)   Wt 209 lb 8 oz (95 kg)   SpO2 99%   BMI 34.86 kg/m  General: Awake, alert, appearing stated age HEENT:  L ear- Canal patent without drainage or erythema, TM is neg R ear- canal patent without drainage or erythema, TM is neg; there is tenderness over the upper portion of the R canal Nose- nares patent and without discharge Mouth- Lips, gums and dentition unremarkable, pharynx is without erythema or exudate Neck: No adenopathy  Lungs: Normal effort, no accessory muscle use Psych: Age appropriate judgment and insight, normal mood and affect  Right ear pain - Plan: cephALEXin (KEFLEX) 500 MG capsule  1 week of above for possible canal cellulitis. Let me know in 2 days if no better. F/u in 2 weeks if symptoms fail to improve, sooner if needed. Pt voiced understanding and agreement to the plan.  [place], DO 08/24/19 4:40 PM

## 2019-08-24 NOTE — Patient Instructions (Signed)
Let me know in 2 days if you are not improving.   Continue using the drops for now.  OK to take Tylenol 1000 mg (2 extra strength tabs) or 975 mg (3 regular strength tabs) every 6 hours as needed.  Ibuprofen 400-600 mg (2-3 over the counter strength tabs) every 6 hours as needed for pain.  Let us know if you need anything.

## 2019-08-26 ENCOUNTER — Encounter: Payer: Self-pay | Admitting: Family Medicine

## 2019-10-11 MED FILL — MEDROXYPROGESTERONE 10 MG T: 10 | 10 days supply | Qty: 10 | Fill #2

## 2019-11-30 IMAGING — MR MRI HEAD WITHOUT AND WITH CONTRAST
20 series · 48 of 48 positions shown · IV contrast (gadavist)
Comparison: Report from examination 09/05/2014. Actual images not
present.

CLINICAL DATA: Follow-up pituitary adenoma

EXAM:
MRI HEAD WITHOUT AND WITH CONTRAST
TECHNIQUE: Multiplanar, multiecho pulse sequences of the brain and surrounding
structures were obtained without and with intravenous contrast.
CONTRAST:  9 cc Gadavist

[Series 2: T1 · sagittal · 5.0mm · 0.45mm/px · 1 of 22 slices shown (1 of 5)]
[im 1/22]
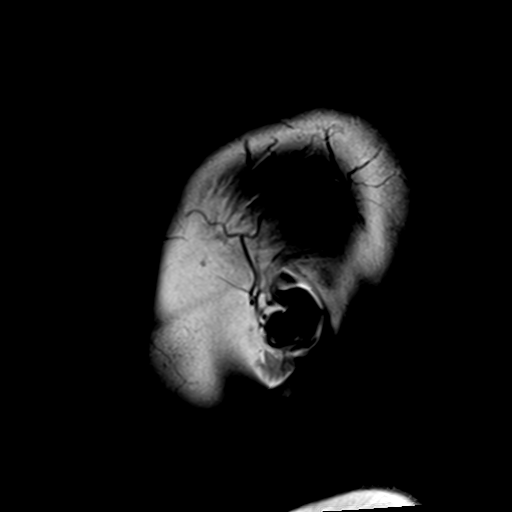

[Series 3: DWI · axial · 3.0mm · 1.20mm/px · z∈[-53,+97]mm · 8 of 102 slices shown (1 of 3)]
[im 1/102]
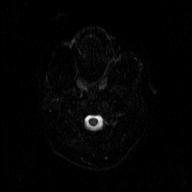
[im 15/102]
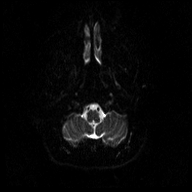
[im 29/102]
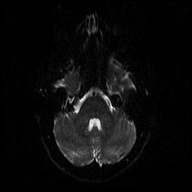
[im 44/102]
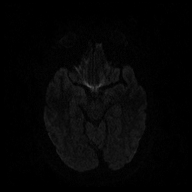
[im 58/102]
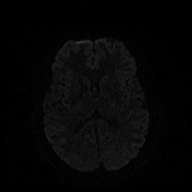
[im 73/102]
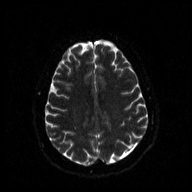
[im 87/102]
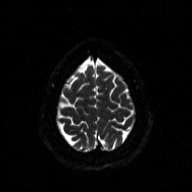
[im 102/102]
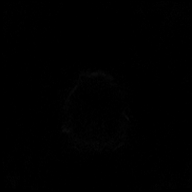

[Series 4: DWI · axial · 3.0mm · 1.20mm/px · z∈[-53,+97]mm · 4 of 51 slices shown (2 of 3)]
[im 1/51]
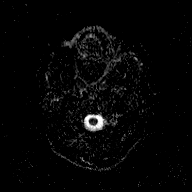
[im 17/51]
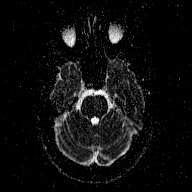
[im 34/51]
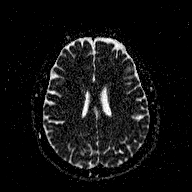
[im 51/51]
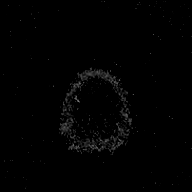

[Series 5: T2 · axial · 5.0mm · 0.90mm/px · z∈[-53,+97]mm · 2 of 26 slices shown (1 of 2)]
[im 1/26]
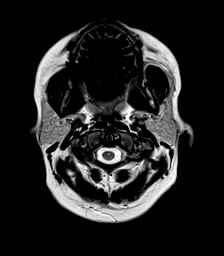
[im 26/26]
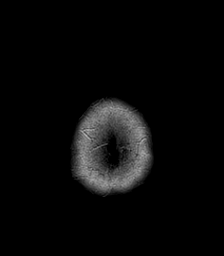

[Series 6: T1 · coronal · 3.0mm · 0.53mm/px · 1 of 15 slices shown (2 of 5)]
[im 1/15]
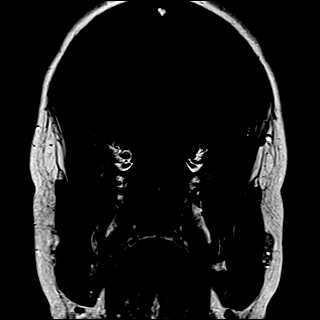

[Series 7: FLAIR · axial · 5.0mm · 0.45mm/px · z∈[-53,+97]mm · 2 of 26 slices shown]
[im 1/26]
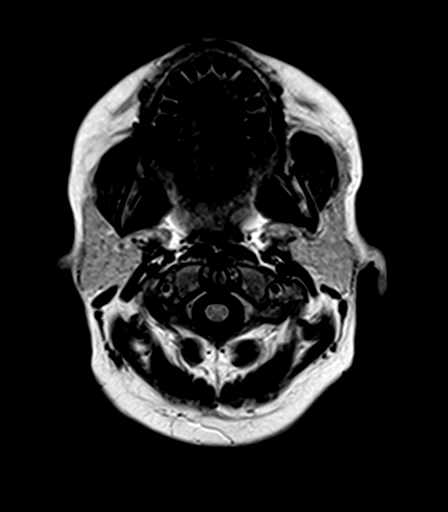
[im 26/26]
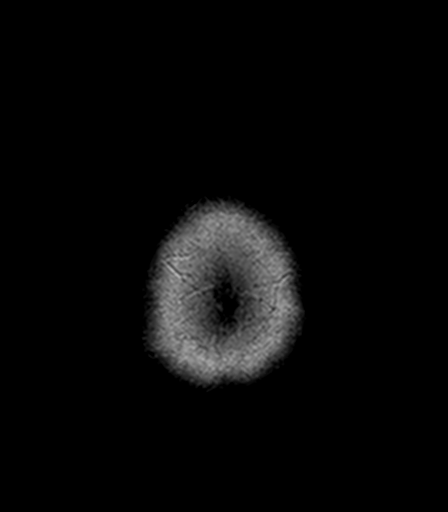

[Series 8: T2 · axial · 5.0mm · 0.72mm/px · z∈[-53,+97]mm · 2 of 26 slices shown (2 of 2)]
[im 1/26]
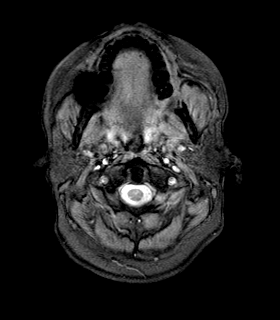
[im 26/26]
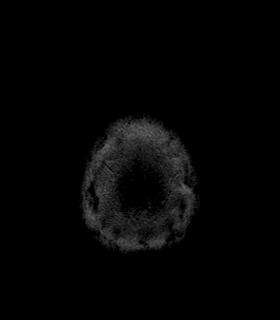

[Series 9: T1 · sagittal · 3.0mm · 0.53mm/px · 1 of 15 slices shown (3 of 5)]
[im 1/15]
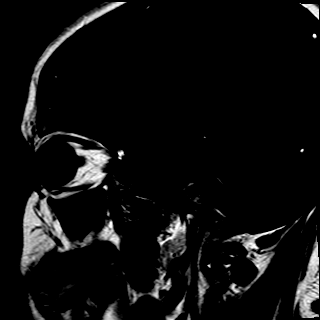

[Series 10: T1 · coronal · non-contrast · 3.0mm · 0.42mm/px · 1 of 9 slices shown (4 of 5)]
[im 1/9]
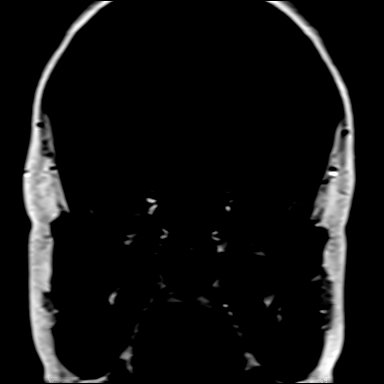

[Series 11: T1 post-contrast · coronal · 3.0mm · 0.62mm/px · 1 of 9 slices shown (1 of 9)]
[im 1/9]
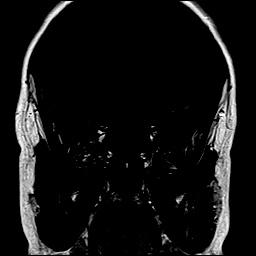

[Series 12: T1 post-contrast · coronal · 3.0mm · 0.62mm/px · 1 of 9 slices shown (2 of 9)]
[im 1/9]
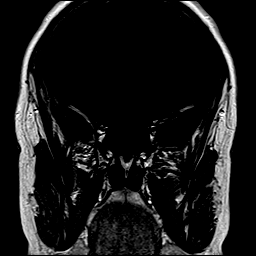

[Series 13: T1 post-contrast · coronal · 3.0mm · 0.62mm/px · 1 of 9 slices shown (3 of 9)]
[im 1/9]
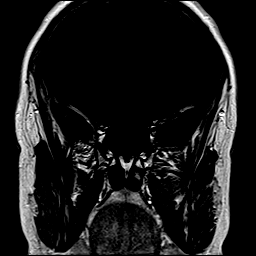

[Series 14: T1 post-contrast · coronal · 3.0mm · 0.62mm/px · 1 of 9 slices shown (4 of 9)]
[im 1/9]
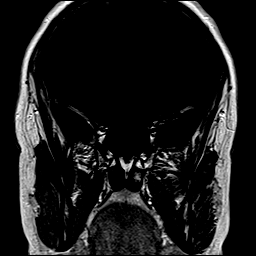

[Series 15: T1 post-contrast · coronal · 3.0mm · 0.62mm/px · 1 of 9 slices shown (5 of 9)]
[im 1/9]
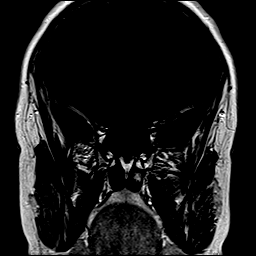

[Series 16: T1 post-contrast · coronal · 3.0mm · 0.62mm/px · 1 of 9 slices shown (6 of 9)]
[im 1/9]
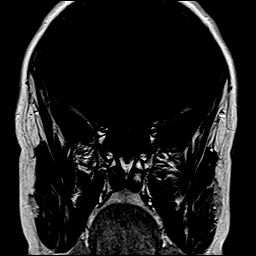

[Series 17: T1 post-contrast · coronal · 3.0mm · 0.44mm/px · 1 of 15 slices shown (7 of 9)]
[im 1/15]
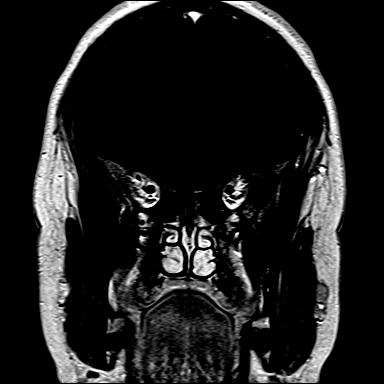

[Series 18: T1 post-contrast · sagittal · 3.0mm · 0.53mm/px · 1 of 15 slices shown (8 of 9)]
[im 1/15]
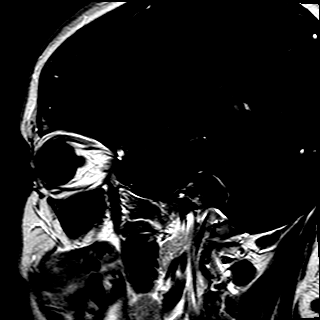

[Series 19: T1 · axial · 1.0mm · 1.00mm/px · z∈[-58,+100]mm · 12 of 160 slices shown (5 of 5)]
[im 1/160]
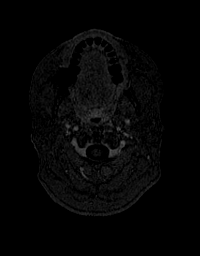
[im 15/160]
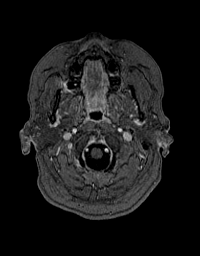
[im 29/160]
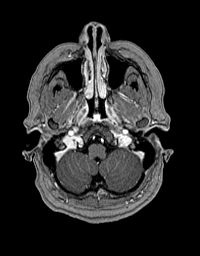
[im 44/160]
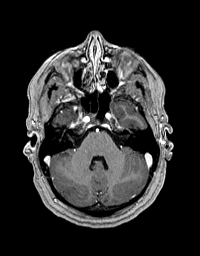
[im 58/160]
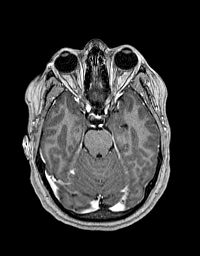
[im 73/160]
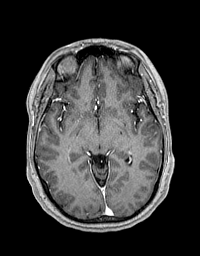
[im 87/160]
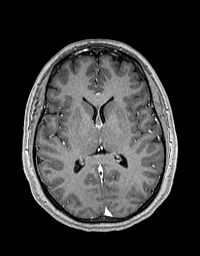
[im 102/160]
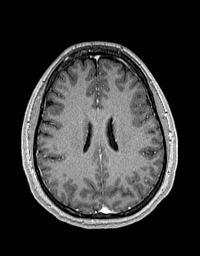
[im 116/160]
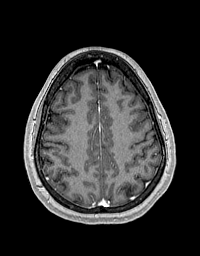
[im 131/160]
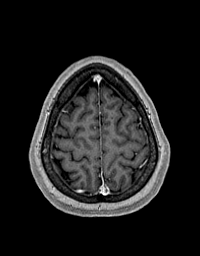
[im 145/160]
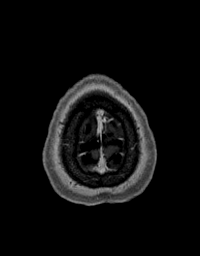
[im 160/160]
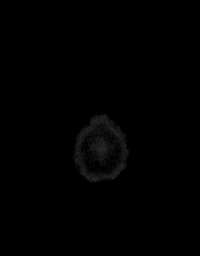

[Series 20: T1 post-contrast · coronal · 5.0mm · 0.43mm/px · 2 of 29 slices shown (9 of 9)]
[im 1/29]
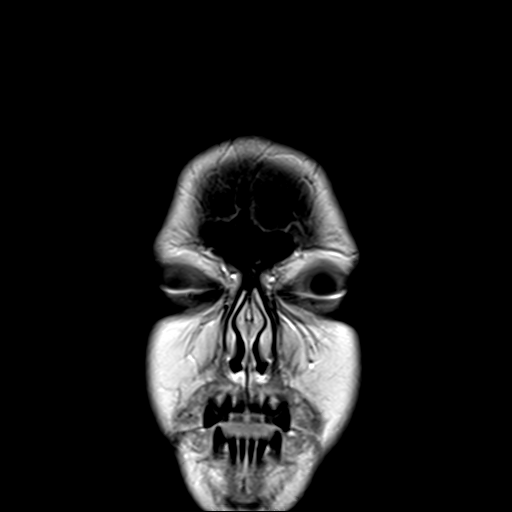
[im 29/29]
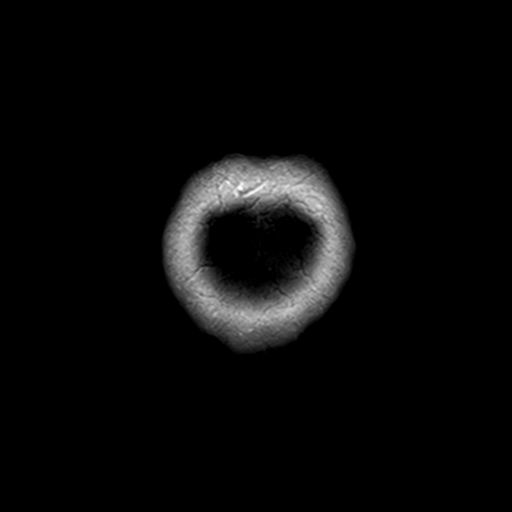

[Series 100: DWI · axial · 3.0mm · 1.20mm/px · z∈[-53,+97]mm · 4 of 51 slices shown (3 of 3)]
[im 1/51]
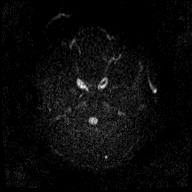
[im 17/51]
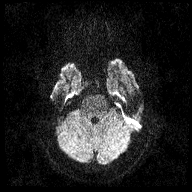
[im 34/51]
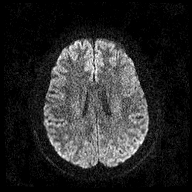
[im 51/51]
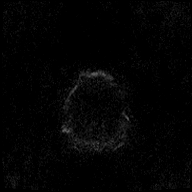

[48 of 48 positions shown; findings below may reference images not displayed]

FINDINGS: Brain: The brain has a normal appearance without evidence of
malformation, atrophy, old or acute small or large vessel
infarction, mass lesion, hemorrhage, hydrocephalus or extra-axial
collection. No abnormal brain enhancement occurs.

The pituitary gland appears normal today. Morphology is normal with
flat upper margin. Infundibulum is midline. Enhancement is uniform
normal.

Vascular: Major vessels at the base of the brain show flow. Venous
sinuses appear patent.

Skull and upper cervical spine: Normal.

Sinuses/Orbits: Clear/normal.

Other: None significant.
IMPRESSION: Normal appearance of the brain itself.

Normal appearance of the pituitary gland. No evidence of mass or
enhancement abnormality.

## 2019-12-07 ENCOUNTER — Encounter: Payer: No Typology Code available for payment source | Admitting: Family Medicine
